# Patient Record
Sex: Male | Born: 1995 | Race: Black or African American | Hispanic: No | Marital: Single
Health system: Midwestern US, Community
[De-identification: ages and names within clinical notes are randomized; demographics above are authoritative.]

## PROBLEM LIST (undated history)

## (undated) DIAGNOSIS — F32A Depression, unspecified: Secondary | ICD-10-CM

## (undated) DIAGNOSIS — R45851 Suicidal ideations: Secondary | ICD-10-CM

## (undated) DIAGNOSIS — F329 Major depressive disorder, single episode, unspecified: Secondary | ICD-10-CM

## (undated) DIAGNOSIS — G8929 Other chronic pain: Secondary | ICD-10-CM

## (undated) DIAGNOSIS — F909 Attention-deficit hyperactivity disorder, unspecified type: Secondary | ICD-10-CM

## (undated) DIAGNOSIS — M549 Dorsalgia, unspecified: Secondary | ICD-10-CM

## (undated) HISTORY — PX: INCISION AND DRAINAGE: SHX5863

---
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
## 2016-07-25 ENCOUNTER — Emergency Department (HOSPITAL_COMMUNITY): Payer: Medicaid Other

## 2016-07-25 ENCOUNTER — Emergency Department (HOSPITAL_COMMUNITY)
Admission: EM | Admit: 2016-07-25 | Discharge: 2016-07-25 | Disposition: A | Discharge status: Home / Self Care | Payer: Medicaid Other | Attending: Emergency Medicine | Admitting: Emergency Medicine

## 2016-07-25 ENCOUNTER — Encounter (HOSPITAL_COMMUNITY): Payer: Self-pay | Admitting: *Deleted

## 2016-07-25 DIAGNOSIS — S93402A Sprain of unspecified ligament of left ankle, initial encounter: Secondary | ICD-10-CM

## 2016-07-25 DIAGNOSIS — Y929 Unspecified place or not applicable: Secondary | ICD-10-CM | POA: Diagnosis not present

## 2016-07-25 DIAGNOSIS — F17210 Nicotine dependence, cigarettes, uncomplicated: Secondary | ICD-10-CM | POA: Insufficient documentation

## 2016-07-25 DIAGNOSIS — F909 Attention-deficit hyperactivity disorder, unspecified type: Secondary | ICD-10-CM | POA: Diagnosis not present

## 2016-07-25 DIAGNOSIS — X501XXA Overexertion from prolonged static or awkward postures, initial encounter: Secondary | ICD-10-CM | POA: Diagnosis not present

## 2016-07-25 DIAGNOSIS — J45909 Unspecified asthma, uncomplicated: Secondary | ICD-10-CM | POA: Diagnosis not present

## 2016-07-25 DIAGNOSIS — Y9367 Activity, basketball: Secondary | ICD-10-CM | POA: Insufficient documentation

## 2016-07-25 DIAGNOSIS — Y998 Other external cause status: Secondary | ICD-10-CM | POA: Diagnosis not present

## 2016-07-25 DIAGNOSIS — S99912A Unspecified injury of left ankle, initial encounter: Secondary | ICD-10-CM | POA: Diagnosis present

## 2016-07-25 MED ORDER — IBUPROFEN 800 MG PO TABS
800.0000 mg | ORAL_TABLET | Freq: Once | ORAL | Status: AC
  Administered 2016-07-25: 800 mg via ORAL
  Filled 2016-07-25: qty 1

## 2016-07-25 MED ORDER — DICLOFENAC SODIUM 75 MG PO TBEC: 75.0000 mg | DELAYED_RELEASE_TABLET | Freq: Two times a day (BID) | ORAL | 0 refills | Status: DC

## 2016-07-25 NOTE — ED Triage Notes (Signed)
Pt c/o left ankle pain after turning his ankle while playing basketball today around 1600.

## 2016-07-26 NOTE — ED Provider Notes (Signed)
[facility] DEPT Provider Note   CSN: [id number] Arrival date & time: 07/25/16  1642     History   Chief Complaint Chief Complaint  Patient presents with  . Ankle Pain    HPI Phillip Sexton is a 21 y.o. male.  The history is provided by the patient. No language interpreter was used.  Ankle Pain   The incident occurred yesterday. The incident occurred at home. There was no injury mechanism. The pain is present in the left ankle. The quality of the pain is described as aching. The pain is moderate. The pain has been constant since onset. Nothing aggravates the symptoms. He has tried nothing for the symptoms. The treatment provided no relief.   Pt turned his ankle while playing basketball Past Medical History:  Diagnosis Date  . Asthma   . Attention deficit hyperactivity disorder (ADHD)   . Back pain, chronic   . Depression   . Suicidal ideation     There are no active problems to display for this patient.   History reviewed. No pertinent surgical history.     Home Medications    Prior to Admission medications   Medication Sig Start Date End Date Taking? Authorizing Provider  acetaminophen (TYLENOL) 500 MG tablet Take 500 mg by mouth every 6 (six) hours as needed for moderate pain or headache.    Historical Provider, MD  albuterol (PROVENTIL HFA;VENTOLIN HFA) 108 (90 BASE) MCG/ACT inhaler Inhale 2 puffs into the lungs every 4 (four) hours as needed for wheezing or shortness of breath. 03/18/15   Cathren Laine, MD  diclofenac (VOLTAREN) 75 MG EC tablet Take 1 tablet (75 mg total) by mouth 2 (two) times daily. 07/25/16   Elson Areas, PA-C  HYDROcodone-acetaminophen (NORCO/VICODIN) 5-325 MG tablet Take one tab po q 4-6 hrs prn pain Patient not taking: Reported on 10/12/2015 06/10/15   Tammy Triplett, PA-C  ibuprofen (ADVIL,MOTRIN) 600 MG tablet Take 1 tablet (600 mg total) by mouth every 6 (six) hours as needed. Patient not taking: Reported on 10/12/2015 06/10/15   Tammy  Triplett, PA-C  predniSONE (DELTASONE) 50 MG tablet Take 1 tablet (50 mg total) by mouth daily. Patient not taking: Reported on 06/10/2015 05/04/15   Dione Booze, MD    Family History No family history on file.  Social History Social History  Substance Use Topics  . Smoking status: Current Every Day Smoker    Packs/day: 1.00    Years: 5.00    Types: Cigarettes  . Smokeless tobacco: Never Used  . Alcohol use No     Allergies   Patient has no known allergies.   Review of Systems Review of Systems  All other systems reviewed and are negative.    Physical Exam Updated Vital Signs BP 110/66 (BP Location: Right Arm)   Pulse (!) 118   Temp 98.3 F (36.8 C) (Oral)   Resp 16   Ht  (1.803 m)   Wt 83.9 kg   SpO2 100%   BMI 25.80 kg/m   Physical Exam  Constitutional: He is oriented to person, place, and time. He appears well-developed.  Musculoskeletal: He exhibits tenderness.  Tender ankle to palpation,  Pain with range of motion,  nv and ns intact  Neurological: He is alert and oriented to person, place, and time.  Skin: Skin is warm.  Psychiatric: He has a normal mood and affect.  Nursing note and vitals reviewed.    ED Treatments / Results  Labs (all labs ordered are  listed, but only abnormal results are displayed) Labs Reviewed - No data to display  EKG  EKG Interpretation None       Radiology Dg Ankle Complete Left  Result Date: 07/25/2016 CLINICAL DATA:  Left ankle sports injury today EXAM: LEFT ANKLE COMPLETE - 3+ VIEW COMPARISON:  None. FINDINGS: Mild dorsal left ankle soft tissue swelling. No fracture or subluxation. No appreciable arthropathy. No suspicious focal osseous lesion. No radiopaque foreign body. IMPRESSION: Mild dorsal left ankle soft tissue swelling with no fracture or subluxation. Electronically Signed   By: Delbert Phenix M.D.   On: 07/25/2016 18:13    Procedures Procedures (including critical care time)  Medications Ordered in  ED Medications  ibuprofen (ADVIL,MOTRIN) tablet 800 mg (800 mg Oral Given 07/25/16 1928)     Initial Impression / Assessment and Plan / ED Course  I have reviewed the triage vital signs and the nursing notes.  Pertinent labs & imaging results that were available during my care of the patient were reviewed by me and considered in my medical decision making (see chart for details).       Final Clinical Impressions(s) / ED Diagnoses   Final diagnoses:  Sprain of left ankle, unspecified ligament, initial encounter    New Prescriptions Discharge Medication List as of 07/25/2016  7:46 PM    START taking these medications   Details  diclofenac (VOLTAREN) 75 MG EC tablet Take 1 tablet (75 mg total) by mouth 2 (two) times daily., Starting Sat 07/25/2016, Print      An After Visit Summary was printed and given to the patient.   Elson Areas, PA-C 07/26/16 0030    Samuel Jester, DO 07/28/16 2005

## 2016-08-31 ENCOUNTER — Emergency Department (HOSPITAL_COMMUNITY): Payer: Medicaid Other

## 2016-08-31 ENCOUNTER — Encounter (HOSPITAL_COMMUNITY): Payer: Self-pay | Admitting: Cardiology

## 2016-08-31 ENCOUNTER — Emergency Department (HOSPITAL_COMMUNITY)
Admission: EM | Admit: 2016-08-31 | Discharge: 2016-08-31 | Disposition: A | Discharge status: Home / Self Care | Payer: Medicaid Other | Attending: Emergency Medicine | Admitting: Emergency Medicine

## 2016-08-31 DIAGNOSIS — Z79899 Other long term (current) drug therapy: Secondary | ICD-10-CM | POA: Diagnosis not present

## 2016-08-31 DIAGNOSIS — L02224 Furuncle of groin: Secondary | ICD-10-CM | POA: Insufficient documentation

## 2016-08-31 DIAGNOSIS — F17210 Nicotine dependence, cigarettes, uncomplicated: Secondary | ICD-10-CM | POA: Insufficient documentation

## 2016-08-31 DIAGNOSIS — N50819 Testicular pain, unspecified: Secondary | ICD-10-CM

## 2016-08-31 DIAGNOSIS — J45909 Unspecified asthma, uncomplicated: Secondary | ICD-10-CM | POA: Diagnosis not present

## 2016-08-31 DIAGNOSIS — F909 Attention-deficit hyperactivity disorder, unspecified type: Secondary | ICD-10-CM | POA: Insufficient documentation

## 2016-08-31 DIAGNOSIS — R222 Localized swelling, mass and lump, trunk: Secondary | ICD-10-CM | POA: Diagnosis present

## 2016-08-31 DIAGNOSIS — L739 Follicular disorder, unspecified: Secondary | ICD-10-CM

## 2016-08-31 MED ORDER — DICLOFENAC SODIUM 75 MG PO TBEC: 75.0000 mg | DELAYED_RELEASE_TABLET | Freq: Two times a day (BID) | ORAL | 0 refills | Status: AC

## 2016-08-31 MED ORDER — SULFAMETHOXAZOLE-TRIMETHOPRIM 800-160 MG PO TABS: 1.0000 | ORAL_TABLET | Freq: Two times a day (BID) | ORAL | 0 refills | Status: AC

## 2016-08-31 MED ORDER — HYDROCODONE-ACETAMINOPHEN 5-325 MG PO TABS
1.0000 | ORAL_TABLET | Freq: Once | ORAL | Status: AC
Start: 2016-08-31 — End: 2016-08-31
  Administered 2016-08-31: 1 via ORAL
  Filled 2016-08-31: qty 1

## 2016-08-31 NOTE — ED Provider Notes (Signed)
[facility] Provider Note   CSN: [id number] Arrival date & time: 08/31/16  [id number]     History   Chief Complaint Chief Complaint  Patient presents with  . Abscess    HPI Phillip Sexton is a 21 y.o. male.  HPI  Phillip Sexton is a 21 y.o. male who presents to the Emergency Department complaining of painful "bumps" to his right groin and scrotum for several days.  He states he noticed a small, hard knot to his scrotum that he squeezed and since that time the pain has increased and it has increased in size. He also complains of pain to his testicles that is worse with movement and describes sharp pains that are intermittent.   He denies dysuria, penile discharge or lesions, abdominal pain, fever or chills.  No drainage. He does states that he recently trimmed his pubic hair.      Past Medical History:  Diagnosis Date  . Asthma   . Attention deficit hyperactivity disorder (ADHD)   . Back pain, chronic   . Depression   . Suicidal ideation     There are no active problems to display for this patient.   Past Surgical History:  Procedure Laterality Date  . INCISION AND DRAINAGE        Home Medications    Prior to Admission medications   Medication Sig Start Date End Date Taking? Authorizing Provider  diclofenac (VOLTAREN) 75 MG EC tablet Take 1 tablet (75 mg total) by mouth 2 (two) times daily. Take with food 08/31/16   Zalma Channing, PA-C  sulfamethoxazole-trimethoprim (BACTRIM DS,SEPTRA DS) 800-160 MG tablet Take 1 tablet by mouth 2 (two) times daily. 08/31/16 09/07/16  Pauline Ausriplett, Irelynn Schermerhorn, PA-C    Family History History reviewed. No pertinent family history.  Social History Social History  Substance Use Topics  . Smoking status: Current Every Day Smoker    Packs/day: 1.00    Years: 5.00    Types: Cigarettes  . Smokeless tobacco: Never Used  . Alcohol use No     Allergies   Patient has no known allergies.   Review of Systems Review of Systems    Constitutional: Negative for chills and fever.  Gastrointestinal: Negative for abdominal pain, nausea and vomiting.  Genitourinary: Positive for scrotal swelling and testicular pain. Negative for difficulty urinating, discharge, dysuria, genital sores and penile pain.  Musculoskeletal: Negative for arthralgias and joint swelling.  Skin: Positive for color change.       Abscess   Neurological: Negative for weakness and numbness.  Hematological: Negative for adenopathy.  All other systems reviewed and are negative.    Physical Exam Updated Vital Signs BP (!) 141/83   Pulse 97   Temp 98.4 F (36.9 C) (Oral)   Resp 16   Ht 5\' 11"  (1.803 m)   Wt 83.9 kg   SpO2 97%   BMI 25.80 kg/m   Physical Exam  Constitutional: He is oriented to person, place, and time. He appears well-developed and well-nourished. No distress.  HENT:  Head: Normocephalic and atraumatic.  Cardiovascular: Normal rate and regular rhythm.   Pulmonary/Chest: Effort normal and breath sounds normal. No respiratory distress.  Abdominal: Soft. He exhibits no distension. There is no tenderness. There is no guarding.  Genitourinary: Penis normal. Cremasteric reflex is present. Right testis shows tenderness. Left testis shows tenderness. Circumcised.  Genitourinary Comments: ttp of bilateral testes with 2 cm palpable, mobile nodule midline scrotum.  No surrounding erythema, fluctuance  Musculoskeletal: Normal range of  motion.  Lymphadenopathy: Inguinal adenopathy noted on the right side.       Right: Inguinal adenopathy present.  Neurological: He is alert and oriented to person, place, and time. He exhibits normal muscle tone. Coordination normal.  Skin: Skin is warm and dry. No rash noted.  Nursing note and vitals reviewed.    ED Treatments / Results  Labs (all labs ordered are listed, but only abnormal results are displayed) Labs Reviewed - No data to display  EKG  EKG Interpretation None        Radiology US Scrotum  Result Date: 08/31/2016 CLINICAL DATA:  Testicular pain, swelling EXAM: SCROTAL ULTRASOUND DOPPLER ULTRASOUND OF THE TESTICLES TECHNIQUE: Complete ultrasound examination of the testicles, epididymis, and other scrotal structures was performed. Color and spectral Doppler ultrasound were also utilized to evaluate blood flow to the testicles. COMPARISON:  None. FINDINGS: Right testicle Measurements: 4.3 x 2.8 x 2.2 cm. No mass or microlithiasis visualized. Left testicle Measurements: 4.2 x 2.5 x 3.1 cm. No mass or microlithiasis visualized. Right epididymis:  Normal in size and appearance. Left epididymis:  Not visualized Hydrocele:  Small bilateral hydroceles, right greater than left Varicocele:  None visualized. Pulsed Doppler interrogation of both testes demonstrates normal low resistance arterial and venous waveforms bilaterally. Other: No visible mass noted in the right inguinal region. There is a small hypoechoic nodule in the soft tissues at the midline measuring 7 x 6 x 5 mm. No internal blood flow. This is of unknown etiology. IMPRESSION: No testicular abnormality.  No torsion. Small bilateral hydroceles. 7 mm hypoechoic nodule in the midline within the subcutaneous soft tissues of the scrotum. No internal blood flow. This is of unknown etiology. If there is evidence for cellulitis/ infection, small abscess cannot be completely excluded, but this is felt unlikely. Electronically Signed   By: Charlett Nose M.D.   On: 08/31/2016 11:27   [place] Art/ven Flow Abd Pelv Doppler  Result Date: 08/31/2016 CLINICAL DATA:  Testicular pain, swelling EXAM: SCROTAL ULTRASOUND DOPPLER ULTRASOUND OF THE TESTICLES TECHNIQUE: Complete ultrasound examination of the testicles, epididymis, and other scrotal structures was performed. Color and spectral Doppler ultrasound were also utilized to evaluate blood flow to the testicles. COMPARISON:  None. FINDINGS: Right testicle Measurements: 4.3 x 2.8 x 2.2  cm. No mass or microlithiasis visualized. Left testicle Measurements: 4.2 x 2.5 x 3.1 cm. No mass or microlithiasis visualized. Right epididymis:  Normal in size and appearance. Left epididymis:  Not visualized Hydrocele:  Small bilateral hydroceles, right greater than left Varicocele:  None visualized. Pulsed Doppler interrogation of both testes demonstrates normal low resistance arterial and venous waveforms bilaterally. Other: No visible mass noted in the right inguinal region. There is a small hypoechoic nodule in the soft tissues at the midline measuring 7 x 6 x 5 mm. No internal blood flow. This is of unknown etiology. IMPRESSION: No testicular abnormality.  No torsion. Small bilateral hydroceles. 7 mm hypoechoic nodule in the midline within the subcutaneous soft tissues of the scrotum. No internal blood flow. This is of unknown etiology. If there is evidence for cellulitis/ infection, small abscess cannot be completely excluded, but this is felt unlikely. Electronically Signed   By: Charlett Nose M.D.   On: 08/31/2016 11:27    Procedures Procedures (including critical care time)  Medications Ordered in ED Medications  HYDROcodone-acetaminophen (NORCO/VICODIN) 5-325 MG per tablet 1 tablet (1 tablet Oral Given 08/31/16 1036)     Initial Impression / Assessment and Plan / ED Course  I have reviewed the triage vital signs and the nursing notes.  Pertinent labs & imaging results that were available during my care of the patient were reviewed by me and considered in my medical decision making (see chart for details).     Pt is well appearing.  Ambulated outside to smoke prior to my exam.  folliculitis w/o obvious abscess.Sx's felt to be secondary to trauma from squeezing.  [place] reassuring, no torsion.   Pt agrees to NSAID,  Warm wet compresses or soaks and Rx for bactrim   Final Clinical Impressions(s) / ED Diagnoses   Final diagnoses:  Testicle pain  Folliculitis    New  Prescriptions Discharge Medication List as of 08/31/2016 12:01 PM    START taking these medications   Details  sulfamethoxazole-trimethoprim (BACTRIM DS,SEPTRA DS) 800-160 MG tablet Take 1 tablet by mouth 2 (two) times daily., Starting Mon 08/31/2016, Until Mon 09/07/2016, Print         Forbes, Cisco, PA-C 08/31/16 1618    Eber Hong, MD 09/06/16 2020

## 2016-08-31 NOTE — ED Triage Notes (Signed)
2 boils to groin area.  Pt states one is on his scrotum.

## 2016-08-31 NOTE — Discharge Instructions (Signed)
Warm wet compresses on/off.  Avoid squeezing the affected areas.  You can contact the clinic listed to establish primary care.

## 2016-08-31 NOTE — ED Notes (Signed)
Spoke to Brewster Hillammy, [place] and states patients symptoms does not indicate need for narcotics.  Patient stated to writer that if he does not get any pain relief he will be back.

## 2016-08-31 NOTE — ED Notes (Signed)
Patient asking for "hydros" for pain instead of Voltaren, PA made aware.

## 2016-08-31 NOTE — ED Notes (Signed)
Pt not in room.

## 2016-11-04 IMAGING — DX DG CHEST 2V
2 series · 2 of 2 positions shown · non-contrast
Comparison: 08/31/2008

CLINICAL DATA: Cough and congestion

EXAM:
CHEST  2 VIEW

[chest pa]
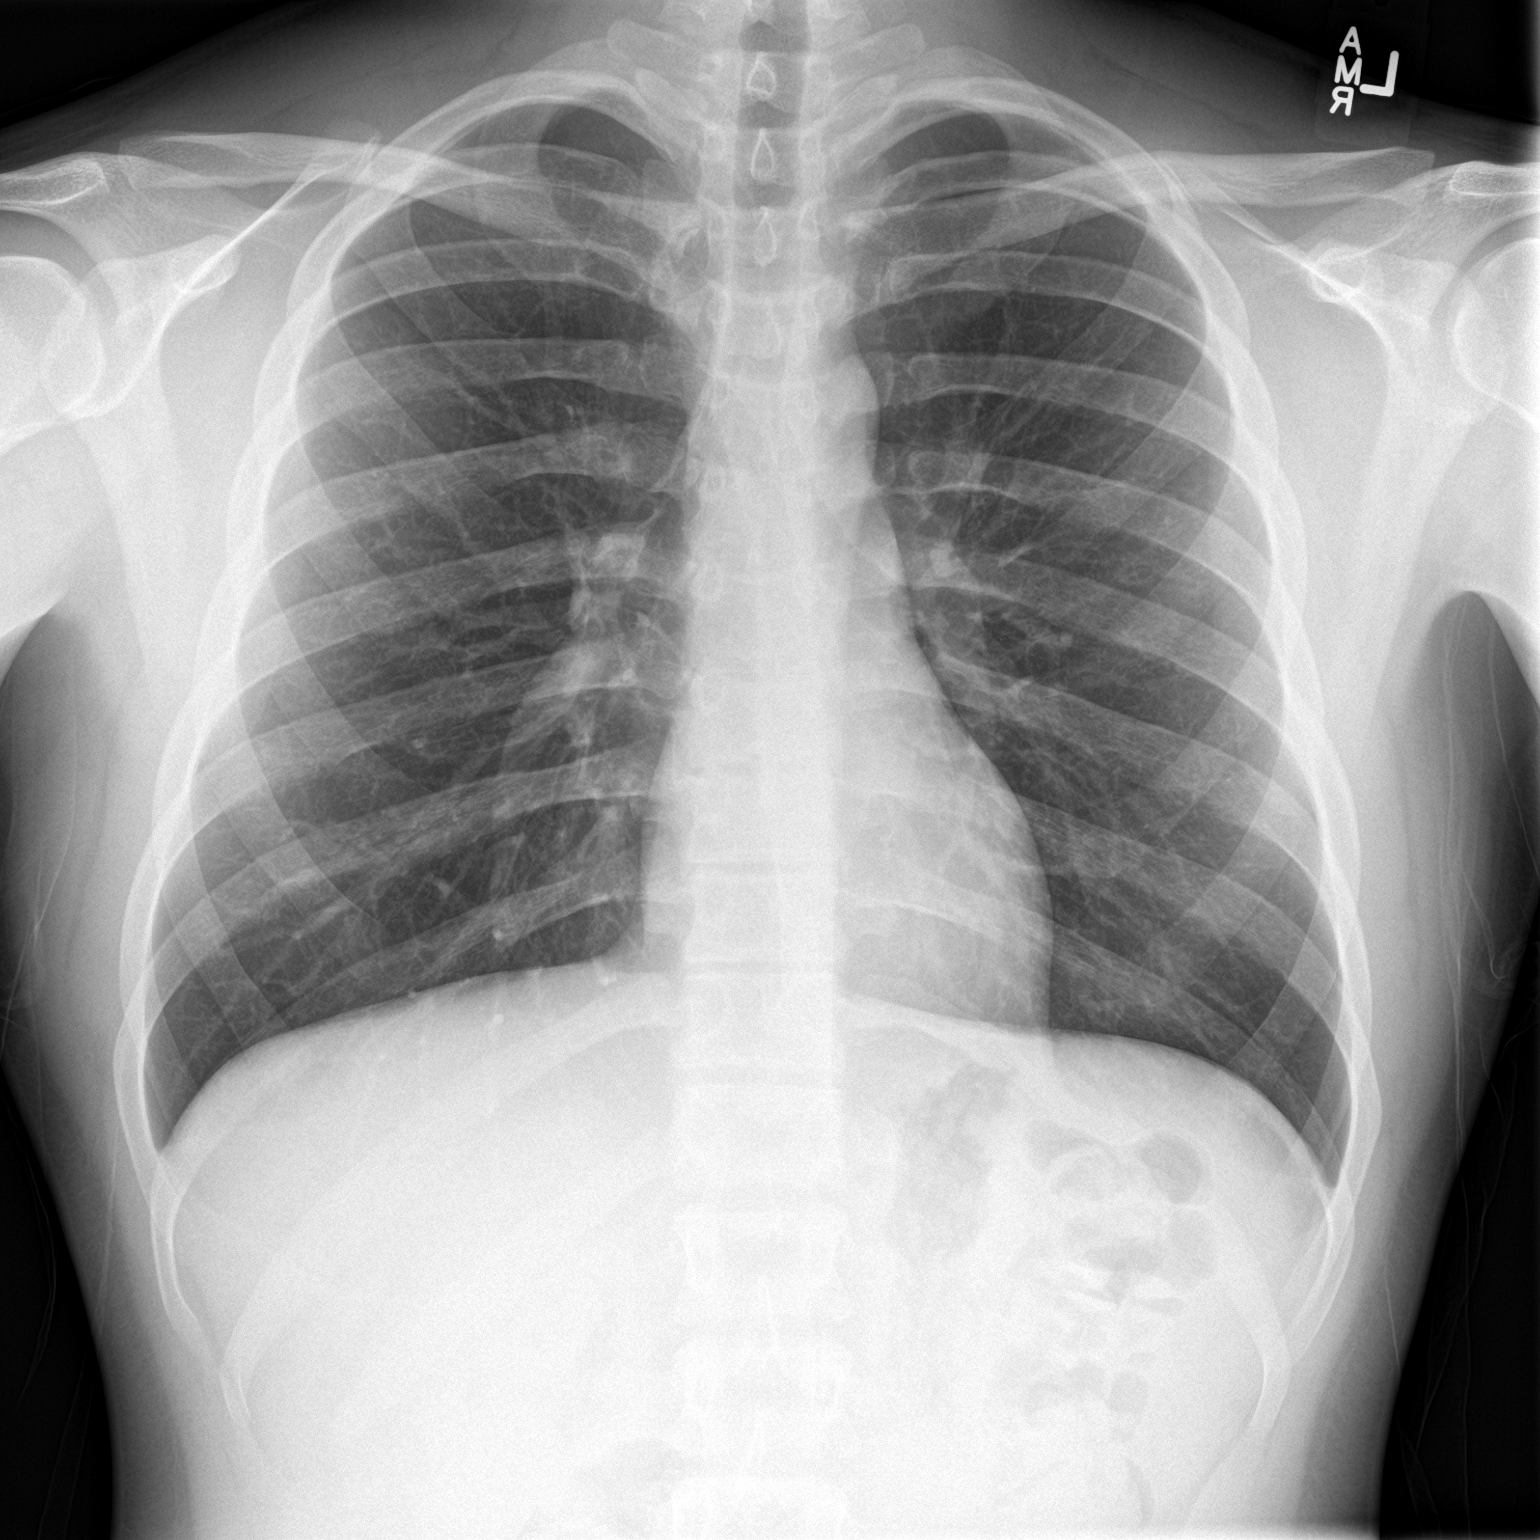

[chest lat]
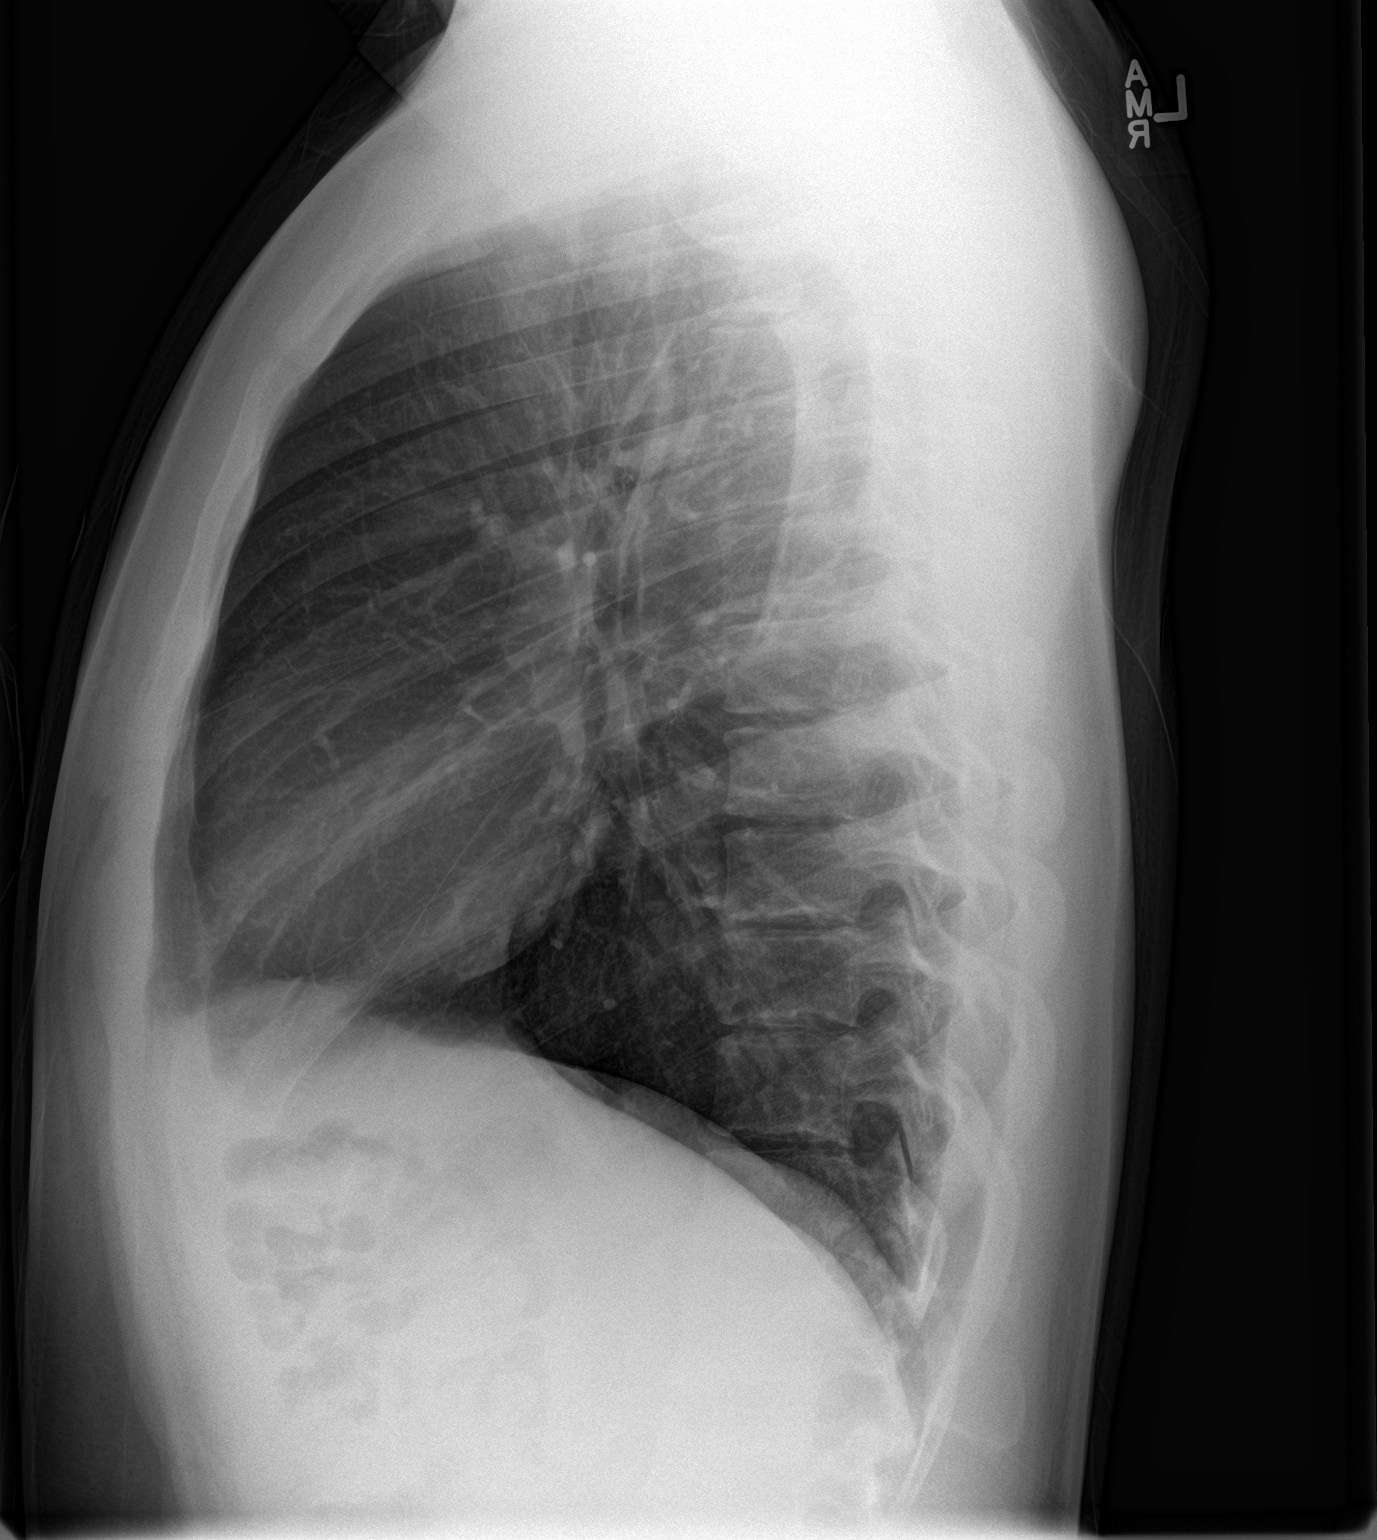

[2 of 2 positions shown; findings below may reference images not displayed]

FINDINGS: Cardiomediastinal silhouette is stable. No acute infiltrate or
pleural effusion. No pulmonary edema. Bony thorax is unremarkable.
IMPRESSION: No active cardiopulmonary disease.

## 2018-03-14 IMAGING — DX DG ANKLE COMPLETE 3+V*L*
3 series · 3 of 3 positions shown · non-contrast
Comparison: None.

CLINICAL DATA: Left ankle sports injury today

EXAM:
LEFT ANKLE COMPLETE - 3+ VIEW

[ankle ap]
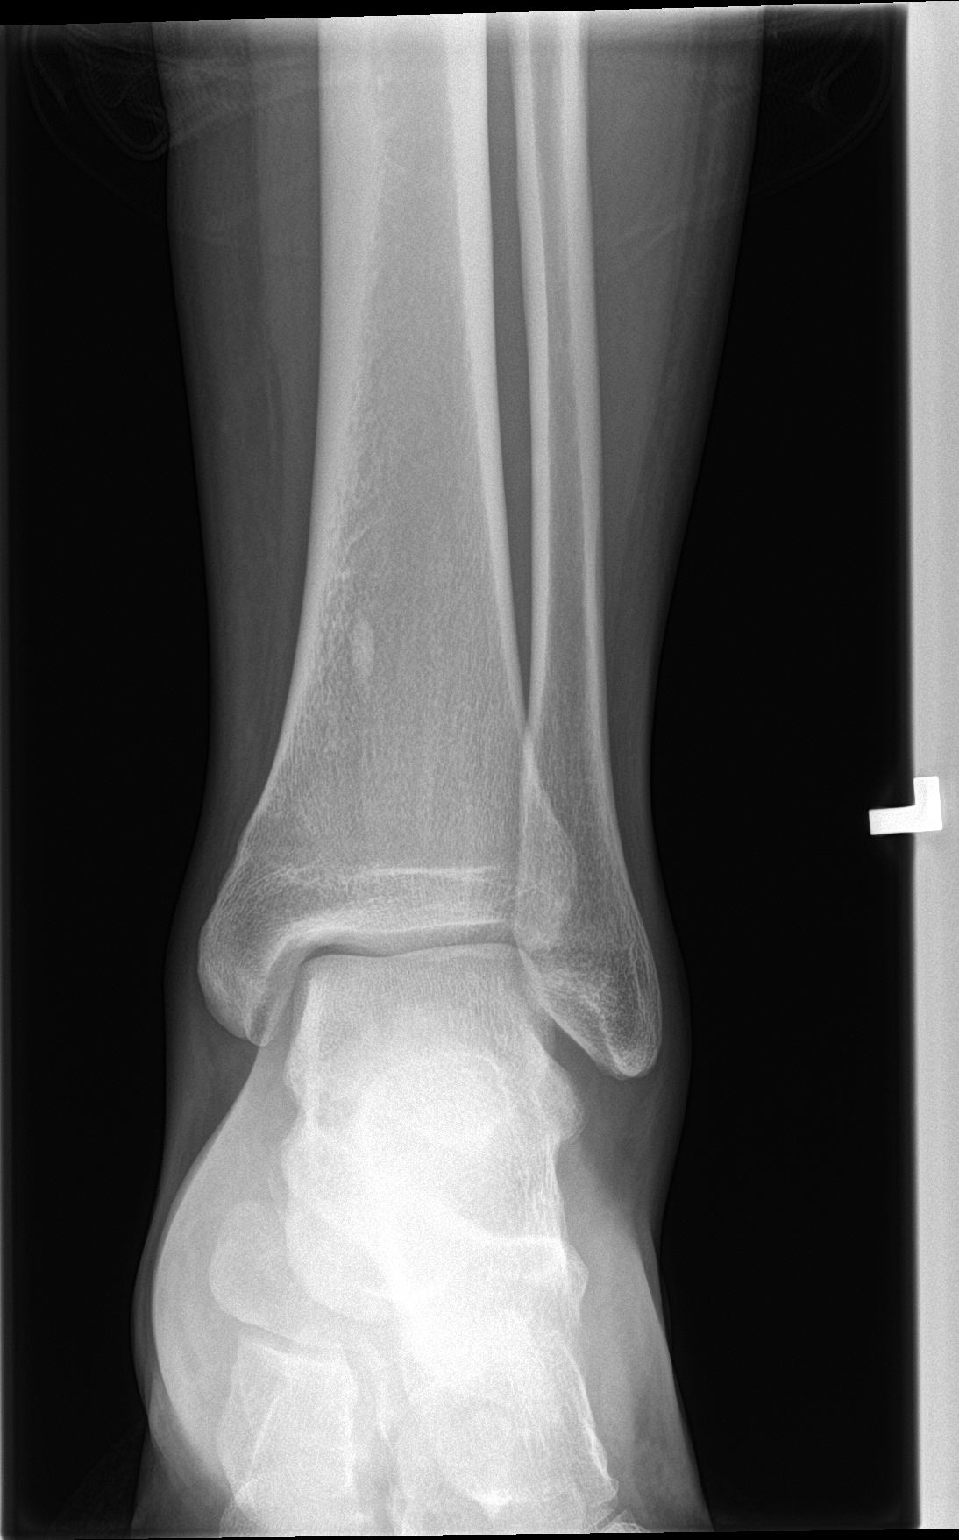

[ankle obl]
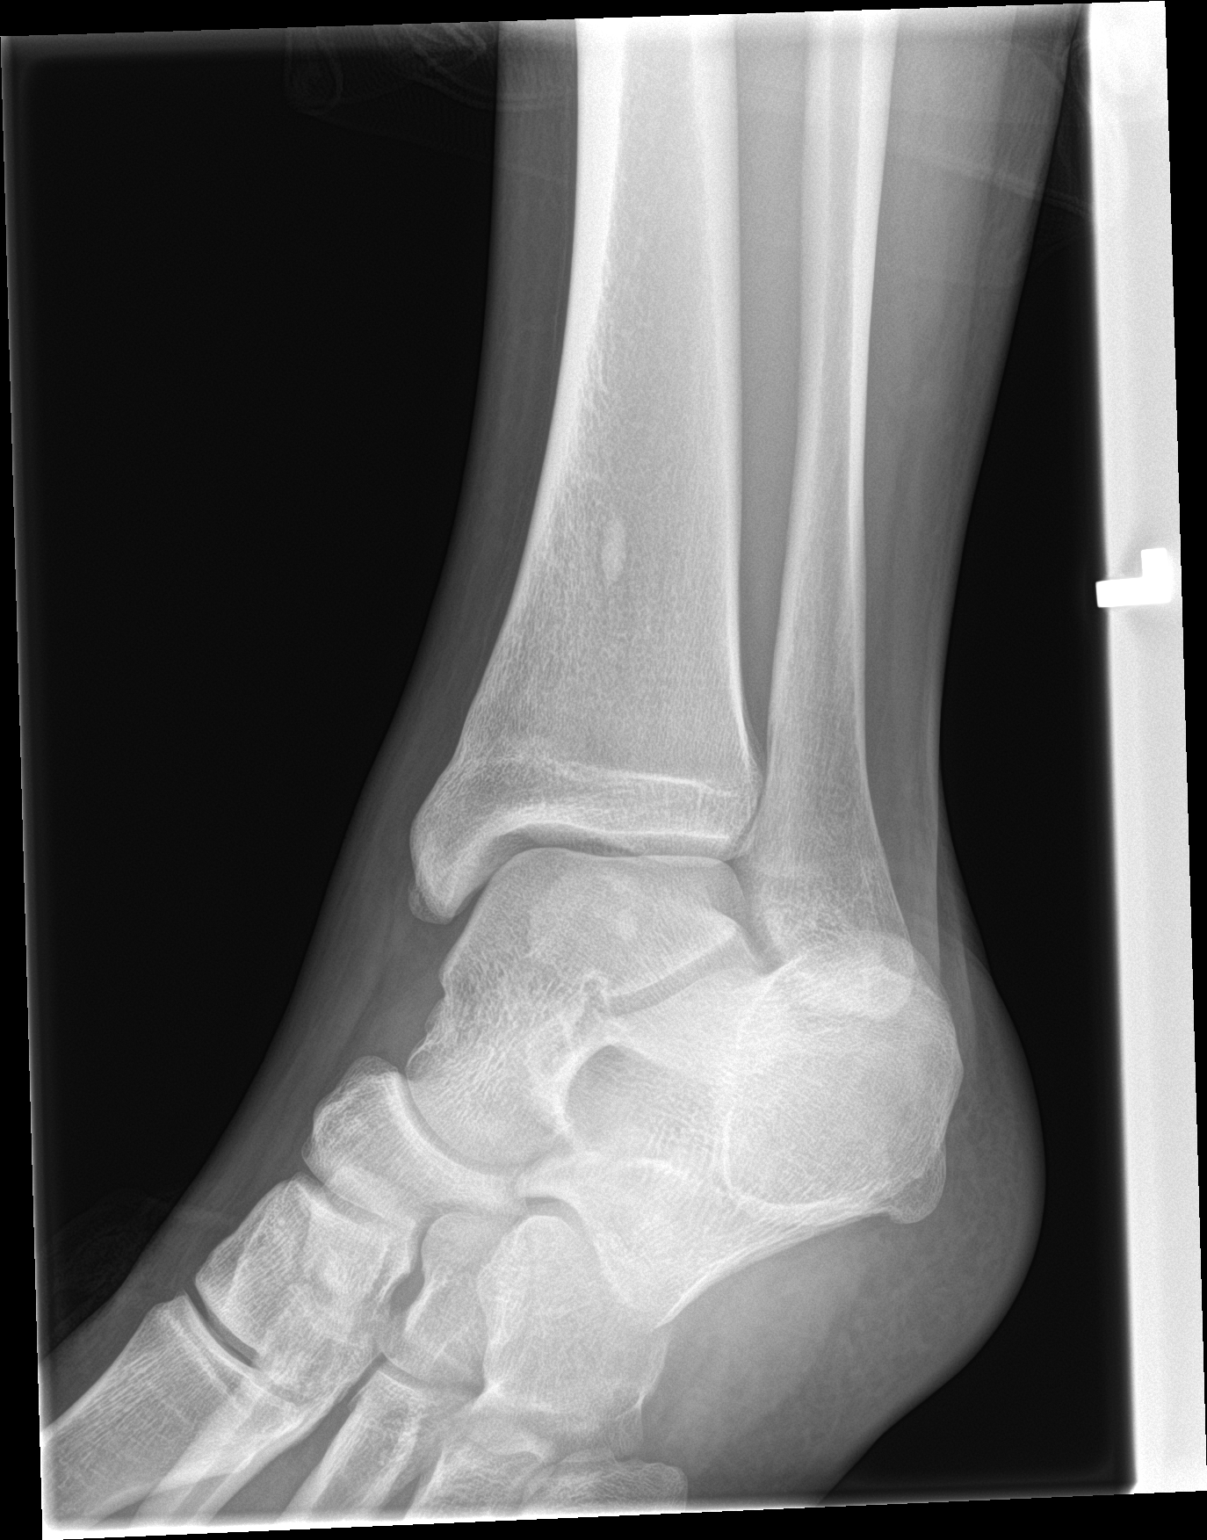

[ankle lat]
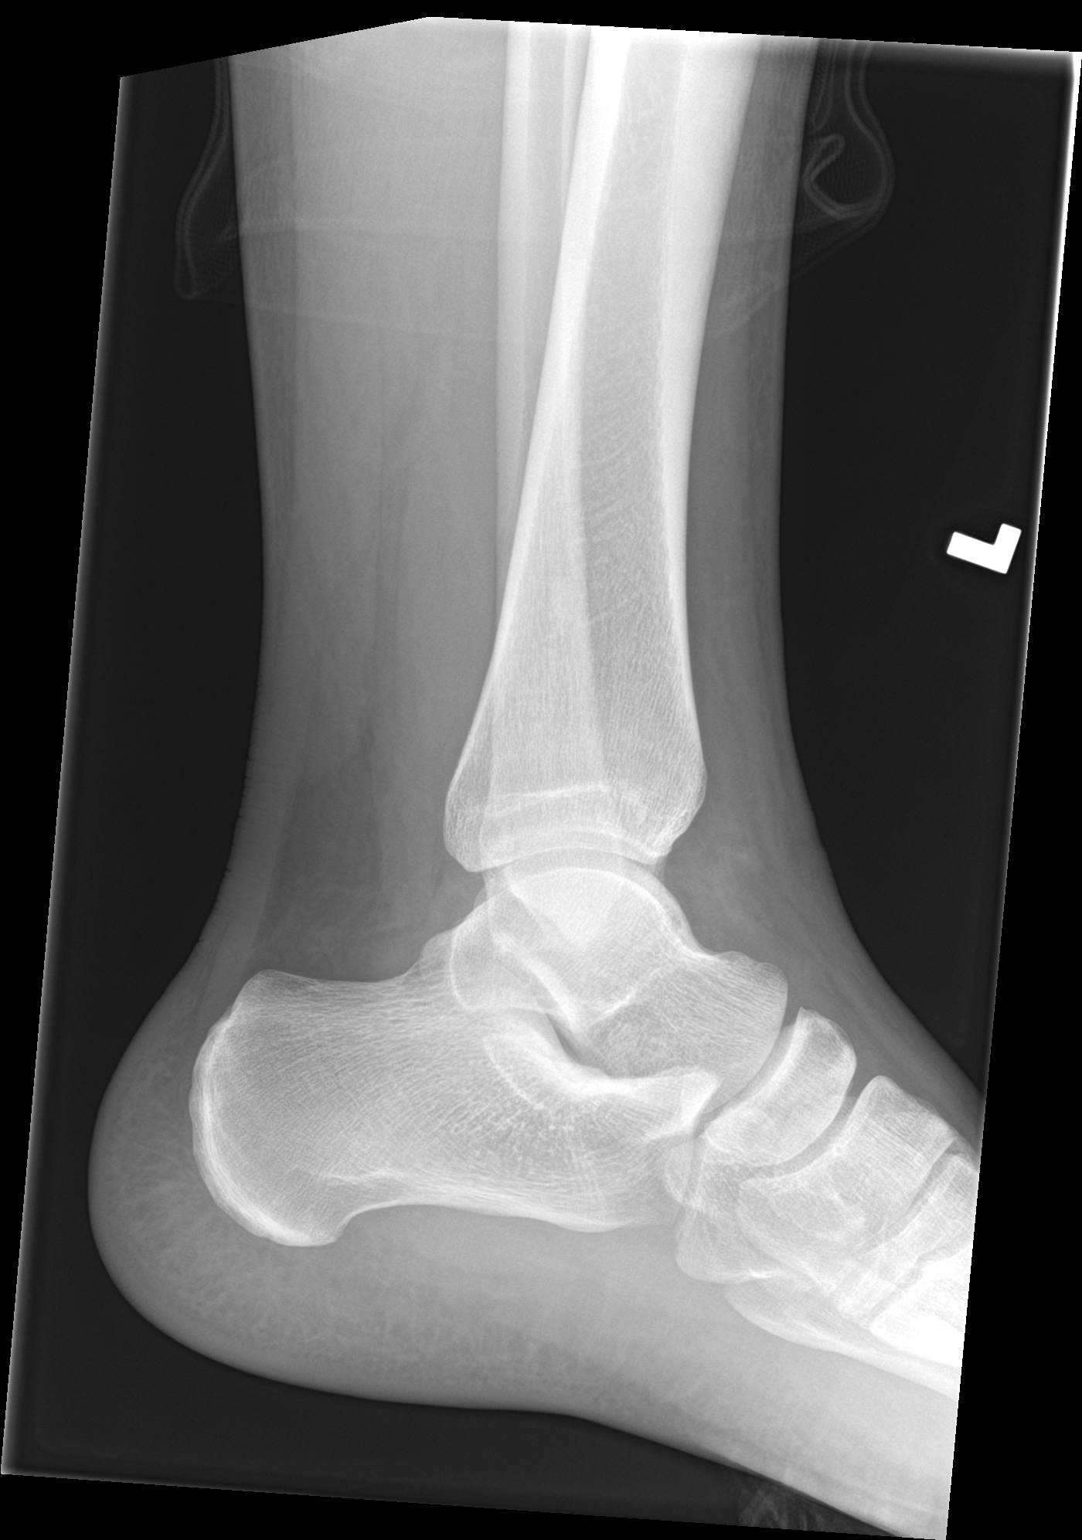

[3 of 3 positions shown; findings below may reference images not displayed]

FINDINGS: Mild dorsal left ankle soft tissue swelling. No fracture or
subluxation. No appreciable arthropathy. No suspicious focal osseous
lesion. No radiopaque foreign body.
IMPRESSION: Mild dorsal left ankle soft tissue swelling with no fracture or
subluxation.

## 2020-12-27 ENCOUNTER — Inpatient Hospital Stay
Admit: 2020-12-27 | Discharge: 2020-12-27 | Disposition: A | Discharge status: Home / Self Care | Attending: Emergency Medicine

## 2020-12-27 DIAGNOSIS — F1910 Other psychoactive substance abuse, uncomplicated: Secondary | ICD-10-CM

## 2020-12-27 LAB — METABOLIC PANEL, BASIC
Anion gap: 9 mmol/L (ref 5–15)
BUN/Creatinine ratio: 12 (ref 12–20)
BUN: 10 MG/DL (ref 6–20)
CO2: 31 mmol/L (ref 21–32)
Calcium: 9.3 MG/DL (ref 8.5–10.1)
Chloride: 98 mmol/L (ref 97–108)
Creatinine: 0.86 MG/DL (ref 0.70–1.30)
GFR est AA: >60 mL/min/{1.73_m2} (ref >60)
GFR est non-AA: >60 mL/min/{1.73_m2} (ref >60)
Glucose: 99 mg/dL (ref 65–100)
Potassium: 2.7 mmol/L — CL (ref 3.5–5.1)
Sodium: 138 mmol/L (ref 136–145)

## 2020-12-27 LAB — URINALYSIS W/ REFLEX CULTURE
BACTERIA, URINE: NEGATIVE /hpf
Bacteria: NEGATIVE /hpf
Blood, Urine: NEGATIVE
Blood: NEGATIVE
Glucose, Ur: NEGATIVE mg/dL
Glucose: NEGATIVE mg/dL
Ketone: 15 mg/dL — AB
Ketones, Urine: 15 mg/dL — AB
Nitrite, Urine: NEGATIVE
Nitrites: NEGATIVE
Protein, UA: 30 mg/dL — AB
Protein: 30 mg/dL — AB
Specific Gravity, UA: 1.03 (ref 1.003–1.030)
Specific gravity: 1.03 (ref 1.003–1.030)
Urobilinogen, UA, POCT: 2 EU/dL — ABNORMAL HIGH (ref 0.2–1.0)
Urobilinogen: 2 EU/dL — ABNORMAL HIGH (ref 0.2–1.0)
pH (UA): 5.5 (ref 5.0–8.0)
pH, UA: 5.5 (ref 5.0–8.0)

## 2020-12-27 LAB — CBC WITH AUTOMATED DIFF
ABS. BASOPHILS: 0.1 10*3/uL (ref 0.0–0.1)
ABS. EOSINOPHILS: 0.2 10*3/uL (ref 0.0–0.4)
ABS. IMM. GRANS.: 0.1 10*3/uL — ABNORMAL HIGH (ref 0.00–0.04)
ABS. LYMPHOCYTES: 1.9 10*3/uL (ref 0.8–3.5)
ABS. MONOCYTES: 1.7 10*3/uL — ABNORMAL HIGH (ref 0.0–1.0)
ABS. NEUTROPHILS: 14.9 10*3/uL — ABNORMAL HIGH (ref 1.8–8.0)
ABSOLUTE NRBC: 0 10*3/uL (ref 0.00–0.01)
BASOPHILS: 1 % (ref 0–1)
EOSINOPHILS: 1 % (ref 0–7)
HCT: 42.7 % (ref 36.6–50.3)
HGB: 14.4 g/dL (ref 12.1–17.0)
IMMATURE GRANULOCYTES: 1 % — ABNORMAL HIGH (ref 0.0–0.5)
LYMPHOCYTES: 10 % — ABNORMAL LOW (ref 12–49)
MCH: 30 PG (ref 26.0–34.0)
MCHC: 33.7 g/dL (ref 30.0–36.5)
MCV: 89 FL (ref 80.0–99.0)
MONOCYTES: 9 % (ref 5–13)
MPV: 10.3 FL (ref 8.9–12.9)
NEUTROPHILS: 78 % — ABNORMAL HIGH (ref 32–75)
NRBC: 0 PER 100 WBC
PLATELET: 292 10*3/uL (ref 150–400)
RBC: 4.8 M/uL (ref 4.10–5.70)
RDW: 12.4 % (ref 11.5–14.5)
WBC: 18.9 10*3/uL — ABNORMAL HIGH (ref 4.1–11.1)

## 2020-12-27 LAB — DRUG SCREEN, URINE
AMPHETAMINES: POSITIVE — AB
Amphetamine Screen, Urine: POSITIVE — AB
BARBITURATES: NEGATIVE
BENZODIAZEPINES: NEGATIVE
Barbiturate Screen, Urine: NEGATIVE
Benzodiazepine Screen, Urine: NEGATIVE
COCAINE: POSITIVE — AB
Cocaine Screen Urine: POSITIVE — AB
METHADONE: NEGATIVE
Methadone Screen, Urine: NEGATIVE
OPIATES: NEGATIVE
Opiate Screen, Urine: NEGATIVE
PCP Screen, Urine: NEGATIVE
PCP(PHENCYCLIDINE): NEGATIVE
THC (TH-CANNABINOL): POSITIVE — AB
THC Screen, Urine: POSITIVE — AB

## 2020-12-27 LAB — BILIRUBIN, CONFIRM: Bilirubin UA, confirm: NEGATIVE

## 2020-12-27 LAB — ETHYL ALCOHOL
ALCOHOL(ETHYL),SERUM: <10 MG/DL (ref <10)
Ethyl Alcohol: <10 MG/DL (ref <10)

## 2020-12-27 LAB — CBC WITH AUTO DIFFERENTIAL
Basophils %: 1 % (ref 0–1)
Basophils Absolute: 0.1 10*3/uL (ref 0.0–0.1)
Eosinophils %: 1 % (ref 0–7)
Eosinophils Absolute: 0.2 10*3/uL (ref 0.0–0.4)
Granulocyte Absolute Count: 0.1 10*3/uL — ABNORMAL HIGH (ref 0.00–0.04)
Hematocrit: 42.7 % (ref 36.6–50.3)
Hemoglobin: 14.4 g/dL (ref 12.1–17.0)
Immature Granulocytes: 1 % — ABNORMAL HIGH (ref 0.0–0.5)
Lymphocytes %: 10 % — ABNORMAL LOW (ref 12–49)
Lymphocytes Absolute: 1.9 10*3/uL (ref 0.8–3.5)
MCH: 30 PG (ref 26.0–34.0)
MCHC: 33.7 g/dL (ref 30.0–36.5)
MCV: 89 FL (ref 80.0–99.0)
MPV: 10.3 FL (ref 8.9–12.9)
Monocytes %: 9 % (ref 5–13)
Monocytes Absolute: 1.7 10*3/uL — ABNORMAL HIGH (ref 0.0–1.0)
NRBC Absolute: 0 10*3/uL (ref 0.00–0.01)
Neutrophils %: 78 % — ABNORMAL HIGH (ref 32–75)
Neutrophils Absolute: 14.9 10*3/uL — ABNORMAL HIGH (ref 1.8–8.0)
Nucleated RBCs: 0 PER 100 WBC
Platelets: 292 10*3/uL (ref 150–400)
RBC: 4.8 M/uL (ref 4.10–5.70)
RDW: 12.4 % (ref 11.5–14.5)
WBC: 18.9 10*3/uL — ABNORMAL HIGH (ref 4.1–11.1)

## 2020-12-27 LAB — BASIC METABOLIC PANEL
Anion Gap: 9 mmol/L (ref 5–15)
BUN: 10 MG/DL (ref 6–20)
Bun/Cre Ratio: 12 (ref 12–20)
CO2: 31 mmol/L (ref 21–32)
Calcium: 9.3 MG/DL (ref 8.5–10.1)
Chloride: 98 mmol/L (ref 97–108)
Creatinine: 0.86 MG/DL (ref 0.70–1.30)
EGFR IF NonAfrican American: >60 mL/min/{1.73_m2} (ref >60)
GFR African American: >60 mL/min/{1.73_m2} (ref >60)
Glucose: 99 mg/dL (ref 65–100)
Potassium: 2.7 mmol/L — CL (ref 3.5–5.1)
Sodium: 138 mmol/L (ref 136–145)

## 2020-12-27 LAB — BILIRUBIN, CONFIRMATORY: Bilirubin Urine: NEGATIVE

## 2020-12-27 MED ORDER — POTASSIUM CHLORIDE SR 10 MEQ TAB
10 mEq | ORAL | Status: AC
Start: 2020-12-27 — End: 2020-12-27
  Administered 2020-12-27: 13:00:00 via ORAL

## 2020-12-27 MED FILL — POTASSIUM CHLORIDE SR 10 MEQ TAB: 10 mEq | ORAL | Qty: 4

## 2020-12-27 NOTE — ED Notes (Signed)
 Reports depression and anxiety after he and his mother had an argument and she kicked him out.  He explains that he group up in group homes.  His mother had cancer and he came to [place] to live with her in Nov 2021.   Pt reports that he and his mother got into an argument a month ago and she told him to leave and hasn't spoken to him since.  Pt reports that he doesn't have any resources or safety net.  Pt has been working at [employer] at nights but didn't got to work last night.  Pt reports shelter is hard for him because of his work schedule.  Reports cocaine use last night.  Pt has been non-compliant with meds for a long time.

## 2020-12-27 NOTE — ED Notes (Signed)
Patient resting on stretcher.  [facility] in to evaluate

## 2020-12-27 NOTE — ED Notes (Signed)
Discharge instructions reviewed with the patient.  Patient remains cooperative and calm.

## 2020-12-27 NOTE — ED Notes (Signed)
Potassium result reported to Dr. Valrie Hart.

## 2020-12-27 NOTE — ED Notes (Signed)
[facility] evaluation completed.

## 2020-12-27 NOTE — ED Notes (Signed)
 Emergency Department Nursing Plan of Care       The Nursing Plan of Care is developed from the Nursing assessment and Emergency Department Attending provider initial evaluation.  The plan of care may be reviewed in the "ED Provider note".    The Plan of Care was developed with the following considerations:   Patient / Family readiness to learn indicated ab:czmajopszi understanding  Persons(s) to be included in education: patient  Barriers to Learning/Limitations:No    Signed     Roseline Dyane Bunker, RN    12/27/2020   8:06 AM

## 2020-12-27 NOTE — ED Notes (Signed)
One black backpack and one bag of patient belongings placed in the cabinet. Patient labels placed on items

## 2020-12-27 NOTE — ED Provider Notes (Signed)
ED Provider Notes by Alfonse Spruce, MD at 12/27/20 [phone]                Author: Alfonse Spruce, MD  Service: --  Author Type: Physician       Filed: 12/27/20 0858  Date of Service: 12/27/20 0750  Status: Signed          Editor: Alfonse Spruce, MD (Physician)               EMERGENCY DEPARTMENT HISTORY AND PHYSICAL EXAM           Date: 12/27/2020   Patient Name: Danny Wilson        History of Presenting Illness          Chief Complaint       Patient presents with        ?  Depression           History Provided By: Patient      HPI: Danny Wilson, 25 y.o. male presents to the ED with cc of being depressed for a long time, out of his psychiatric medication, homeless.  Patient  is asking for some help.  Currently denies SI or HI, admits to using cocaine and other substances yesterday.         There are no other associated symptoms, patient concerns, or physical findings at this time.   I reviewed the vital signs, available nursing notes, past medical history, past surgical history, family history and social history.         Vital Signs:   Patient Vitals for the past 12 hrs:           Temp  Resp  BP  SpO2           12/27/20 0739  98.6 ??F (37 ??C)  16  (!) 146/87  99 %        Vital signs reviewed.      Current Medications:     No current facility-administered medications on file prior to encounter.          No current outpatient medications on file prior to encounter.             Past History        Past Medical History:   No past medical history on file.      Past Surgical History:   No past surgical history on file.      Family History:   No family history on file.      Social History:          Allergies:   No Known Allergies           Review of Systems     Review of Systems    Constitutional:  Negative for fever.    HENT:  Negative for sore throat and trouble swallowing.     Eyes:  Negative for photophobia and redness.    Respiratory:  Negative for cough and shortness of breath.     Cardiovascular:  Negative for  chest pain and leg swelling.    Gastrointestinal:  Negative for abdominal pain, constipation, diarrhea, nausea and vomiting.    Endocrine: Negative for polydipsia and polyuria.    Genitourinary:  Negative for dysuria, hematuria and scrotal swelling.    Musculoskeletal:  Negative for back pain and joint swelling.    Skin:  Negative for rash.    Neurological:  Negative for dizziness, syncope, weakness and headaches.    Psychiatric/Behavioral:  Positive for behavioral problems,  decreased concentration and sleep disturbance. Negative for suicidal ideas.      All other systems reviewed and are negative.        Physical Exam     Physical Exam   Vitals and nursing note reviewed. Exam conducted with a chaperone present.    Constitutional:        General: He is not in acute distress.      Appearance: Normal appearance. He is well-developed.    HENT:       Head: Normocephalic and atraumatic.       Mouth/Throat:       Pharynx: No oropharyngeal exudate.    Eyes:       General:          Right eye: No discharge.          Left eye: No discharge.       Extraocular Movements: Extraocular movements intact.       Conjunctiva/sclera: Conjunctivae normal.       Pupils: Pupils are equal, round, and reactive to light.    Neck:       Vascular: No JVD.    Cardiovascular:       Rate and Rhythm: Normal rate and regular rhythm.       Heart sounds: Normal heart sounds.    Pulmonary:       Effort: Pulmonary effort is normal. No respiratory distress.       Breath sounds: Normal breath sounds. No wheezing.     Abdominal:       General: Bowel sounds are normal. There is no distension.       Palpations: Abdomen is soft.       Tenderness: There is no abdominal tenderness. There is no guarding or rebound.     Musculoskeletal:          General: No tenderness. Normal range of motion.       Cervical back: Normal range of motion and neck supple.     Lymphadenopathy:       Cervical: No cervical adenopathy.    Skin:      General: Skin is warm and dry.        Capillary Refill: Capillary refill takes less than 2 seconds.       Findings: No rash.    Neurological:       General: No focal deficit present.       Mental Status: He is alert and oriented to person, place, and time.       Cranial Nerves: No cranial nerve deficit.       Deep Tendon Reflexes: Reflexes are normal and symmetric.    Psychiatric:          Behavior: Behavior normal.       Comments: Depressed mood, denies SI or HI.            Emergency Department Course     ED Course:   Initial assessment performed. The patient's complaints have been discussed, and they are in agreement with the care plan formulated and outlined with them. I have encouraged them to ask questions as  they arise throughout their visit.      EKG interpretation: (Preliminary)      Medical Decision Making:   Major depression, polysubstance abuse.      Critical Care Time:         Procedure:         Progress note:    Time:8:50 am patient  has been evaluated by Victorino Dike from [facility] and she wants patient to [facility], patient will be discharged.      Disposition:   DISCHARGED at 9:20 am,  I reviewed exam findings, diagnostic results, and clinical impression with patient. Counseled patient on diagnosis and care plan. Encouraged patient to ask questions and discussed  need for follow up with primary care and to return to ED precautions. Patient expresses understanding at this time. I have reviewed discharge instructions with the patient and/or family/caregiver who verbalized understanding. The patient has been re-evaluated  and is ready for discharge. Discharge instructions have been provided and explained to the patient. Ready for discharge.      DISCHARGE PLAN:   1. There are no discharge medications for this patient.      2.      Follow-up Information      None             3.  Return to ED if current symptoms worsen or new symptoms arise.    4. Follow up with No primary care provider on file. in 3-5 days.             Diagnosis        Clinical Impression: No diagnosis found.

## 2020-12-27 NOTE — ED Notes (Signed)
Patient discharged ambulatory to awaiting discharge transportation
